# Patient Record
Sex: Male | Born: 1995 | Race: White | Hispanic: No | Marital: Single | Smoking: Never smoker
Health system: Southern US, Community
[De-identification: ages and names within clinical notes are randomized; demographics above are authoritative.]

## PROBLEM LIST (undated history)

## (undated) DIAGNOSIS — I319 Disease of pericardium, unspecified: Secondary | ICD-10-CM

---
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
## 2022-05-15 ENCOUNTER — Other Ambulatory Visit (HOSPITAL_COMMUNITY): Payer: Self-pay

## 2022-05-15 MED ORDER — SULFACETAMIDE SODIUM 10 % OP SOLN
2.0000 [drp] | OPHTHALMIC | 0 refills | Status: AC
  Filled 2022-05-15: qty 15, 7d supply, fill #0

## 2024-10-04 ENCOUNTER — Emergency Department (HOSPITAL_COMMUNITY)
Admission: EM | Admit: 2024-10-04 | Discharge: 2024-10-04 | Disposition: A | Discharge status: Home / Self Care | Payer: Self-pay | Attending: Emergency Medicine | Admitting: Emergency Medicine

## 2024-10-04 ENCOUNTER — Other Ambulatory Visit (HOSPITAL_COMMUNITY): Payer: Self-pay

## 2024-10-04 ENCOUNTER — Encounter (HOSPITAL_COMMUNITY): Payer: Self-pay

## 2024-10-04 ENCOUNTER — Other Ambulatory Visit: Payer: Self-pay

## 2024-10-04 DIAGNOSIS — L03211 Cellulitis of face: Secondary | ICD-10-CM | POA: Insufficient documentation

## 2024-10-04 MED ORDER — CEPHALEXIN 500 MG PO CAPS
500.0000 mg | ORAL_CAPSULE | Freq: Once | ORAL | Status: DC
  Filled 2024-10-04: qty 1

## 2024-10-04 MED ORDER — CEPHALEXIN 500 MG PO CAPS: 500.0000 mg | ORAL_CAPSULE | Freq: Four times a day (QID) | ORAL | 0 refills | Status: DC

## 2024-10-04 MED ORDER — CEPHALEXIN 500 MG PO CAPS
500.0000 mg | ORAL_CAPSULE | Freq: Four times a day (QID) | ORAL | 0 refills | Status: AC
  Filled 2024-10-04: qty 28, 7d supply, fill #0

## 2024-10-04 NOTE — ED Provider Notes (Signed)
" °  [place] EMERGENCY DEPARTMENT AT [facility] Provider Note   CSN: [id number] Arrival date & time: 10/04/24  1329     Patient presents with: Eye Problem   Tommy Wilkerson is a 28 y.o. male.    Eye Problem Patient has left eye area pain and swelling.  Has had for around 4 days.  2 days ago seen at urgent care diagnosed with periorbital cellulitis and given doxycycline.  Now has a little more swelling.  Still systemically feeling well.  No fevers no chills.     Prior to Admission medications  Medication Sig Start Date End Date Taking? Authorizing Provider  cephALEXin  (KEFLEX ) 500 MG capsule Take 1 capsule (500 mg total) by mouth 4 (four) times daily. 10/04/24   Patsey Lot, MD  Multiple Vitamins-Minerals (MULTIVITAMINS THER. W/MINERALS) TABS Take 1 tablet by mouth daily.      [provider]    Allergies: Patient has no known allergies.    Review of Systems  Updated Vital Signs BP 117/64 (BP Location: Right Arm)   Pulse 62   Temp 98.2 F (36.8 C) (Oral)   Resp 18   Ht 5' 8 (1.727 m)   Wt 54.4 kg   SpO2 99%   BMI 18.24 kg/m   Physical Exam Vitals reviewed.  HENT:     Head: Atraumatic.     Comments: Some erythema and induration medial to the eye on the nose.  No definite fluctuance.  Bedside ultrasound done and no fluid collection.  No tearing.  No purulence in the eye.  Painless eye movements. Eyes:   Musculoskeletal:     Cervical back: Neck supple.  Neurological:     Mental Status: He is alert.     (all labs ordered are listed, but only abnormal results are displayed) Labs Reviewed - No data to display  EKG: None  Radiology: No results found.   Procedures   Medications Ordered in the ED  cephALEXin  (KEFLEX ) capsule 500 mg (has no administration in time range)                                    Medical Decision Making Risk Prescription drug management.   Patient with redness and swelling with some induration  medial to the left eye.  Differential diagnose includes abscess cellulitis but also dacryocystitis.  Appears a little superior for dacryocystitis and is having no difficulty with the draining of his eyes.  Has been on doxycycline.  Pain with eye movements makes orbital cellulitis less likely.  Discussed with ED pharmacist Dorn.  Will add Keflex  for more broad coverage but will continue Doxy for MRSA coverage.  Will have follow-up with ophthalmology for further evaluation.  Appears stable to discharge home however.     Final diagnoses:  Facial cellulitis    ED Discharge Orders          Ordered    cephALEXin  (KEFLEX ) 500 MG capsule  4 times daily,   Status:  Discontinued        10/04/24 1643    cephALEXin  (KEFLEX ) 500 MG capsule  4 times daily        10/04/24 1643               Patsey Lot, MD 10/04/24 1643  "

## 2024-10-04 NOTE — Discharge Instructions (Addendum)
 Follow-up with the eye doctor for further evaluation of the swelling does not improve.  Watch for systemic symptoms.  Take the new antibiotic in addition to the doxycycline that you are already on.

## 2024-10-04 NOTE — ED Triage Notes (Signed)
 Pt presents with swelling and irritation around the L.eye. Pt states he went to urgent care two days ago, got Doxycycline prescribed; symptoms have gotten worse.  Denies N&V,D. No SOB. NAD.

## 2024-10-05 ENCOUNTER — Other Ambulatory Visit: Payer: Self-pay
